# Patient Record
Sex: Male | Born: 1995 | Hispanic: No | Marital: Single | Smoking: Current some day smoker
Health system: Southern US, Community
[De-identification: ages and names within clinical notes are randomized; demographics above are authoritative.]

## PROBLEM LIST (undated history)

## (undated) HISTORY — PX: APPENDECTOMY: SHX54

---
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
## 2015-04-30 ENCOUNTER — Ambulatory Visit (INDEPENDENT_AMBULATORY_CARE_PROVIDER_SITE_OTHER): Payer: Medicaid Other | Admitting: Licensed Clinical Social Worker

## 2015-04-30 DIAGNOSIS — Z62820 Parent-biological child conflict: Secondary | ICD-10-CM

## 2015-04-30 NOTE — BH Specialist Note (Signed)
Referring Provider: Maia Breslow, MD Session Time:  4:00 - 5:00 (1 hour) Type of Service: Behavioral Health - Individual/Family Interpreter: No.  Interpreter Name & Language: NA   PRESENTING CONCERNS:  Bakari Nikolai is a 19 y.o. male brought in by mother. Shadrack Brummitt was referred to [facility] for parent child relationship problems.   GOALS ADDRESSED:  Enhance positive coping skills including breathing, progressive muscle relaxation, and Mindshift app Enhance positive child-parent interactions by teaching specific praise and selective ignoring to mom    INTERVENTIONS:  Anger/impulse managment Assessed current condition/needs Built rapport Observed parent-child interaction Relationship training Stress managment    ASSESSMENT/OUTCOME:  Rasheem is smiling and candid about his anger in his family relationships. Mom is in agreement. When offered parenting support at the beginning of the session, Cadon quickly intervened and said that his parents' parenting is not the problem, just his anger and then resulting anguish and remorse after "blanking" on family members. History of trust issues within the parent-child relationship. Discussed relationship training, including rapport-building and making requests within relationships.   Discussed anger and basic brain chemistry. Encouraged learning coping strategies before he's progressed to full-blown anger in order to learn. Encouraged practicing at least 30 days to make a new habit. Mindshift app practiced, Dennis was very enthusiastic.   Mom has many complaints about Peder's behavior but does say some very nice things about him, including that he is the "kindest" and "quickest to compromise" out of her 4 children. Reflected to both, Arman shared appreciation with his mom. Encouraged mom to keep giving praise like this.     TREATMENT PLAN:  Yuta will use Mindshift to learn more about anger and anxiety.  He will be  proactive, practice relaxation proactively and continuously manage his anger, sense that this is where his anxiety comes from. Mom will give praise and use selective ignoring when thinking about Santino's behaviors. Family voiced agreement and satisfaction.   PLAN FOR NEXT VISIT: Perle wants to try on his own. Either Roseanne Reno or his mother can contact for additional sessions, either together, with Roseanne Reno alone, or with mom and dad for strategies for parenting teenagers.   Scheduled next visit: No next visit at this time.   Meyer Arora Jonah Blue [facility] Clinician [facility]

## 2016-04-04 ENCOUNTER — Emergency Department (HOSPITAL_COMMUNITY)
Admission: EM | Admit: 2016-04-04 | Discharge: 2016-04-04 | Disposition: A | Discharge status: Home / Self Care | Payer: BLUE CROSS/BLUE SHIELD | Attending: Emergency Medicine | Admitting: Emergency Medicine

## 2016-04-04 ENCOUNTER — Encounter (HOSPITAL_COMMUNITY): Payer: Self-pay | Admitting: Emergency Medicine

## 2016-04-04 DIAGNOSIS — Y999 Unspecified external cause status: Secondary | ICD-10-CM | POA: Insufficient documentation

## 2016-04-04 DIAGNOSIS — Y939 Activity, unspecified: Secondary | ICD-10-CM | POA: Insufficient documentation

## 2016-04-04 DIAGNOSIS — L03115 Cellulitis of right lower limb: Secondary | ICD-10-CM | POA: Diagnosis not present

## 2016-04-04 DIAGNOSIS — Y929 Unspecified place or not applicable: Secondary | ICD-10-CM | POA: Insufficient documentation

## 2016-04-04 DIAGNOSIS — F17210 Nicotine dependence, cigarettes, uncomplicated: Secondary | ICD-10-CM | POA: Diagnosis not present

## 2016-04-04 DIAGNOSIS — W57XXXA Bitten or stung by nonvenomous insect and other nonvenomous arthropods, initial encounter: Secondary | ICD-10-CM | POA: Diagnosis not present

## 2016-04-04 DIAGNOSIS — S80861A Insect bite (nonvenomous), right lower leg, initial encounter: Secondary | ICD-10-CM | POA: Insufficient documentation

## 2016-04-04 MED ORDER — DIPHENHYDRAMINE HCL 25 MG PO CAPS
25.0000 mg | ORAL_CAPSULE | Freq: Once | ORAL | Status: AC
  Administered 2016-04-04: 25 mg via ORAL
  Filled 2016-04-04: qty 1

## 2016-04-04 MED ORDER — DOXYCYCLINE HYCLATE 100 MG PO CAPS: 100.0000 mg | ORAL_CAPSULE | Freq: Two times a day (BID) | ORAL | Status: DC

## 2016-04-04 MED ORDER — ACETAMINOPHEN 325 MG PO TABS
650.0000 mg | ORAL_TABLET | Freq: Once | ORAL | Status: AC
  Administered 2016-04-04: 650 mg via ORAL
  Filled 2016-04-04: qty 2

## 2016-04-04 NOTE — Discharge Instructions (Signed)
Read the information below.   You are being prescribed an antibiotics. Take as prescribed. You can take claritin or zyrtec for itch relief. Keep leg elevated. You can take tylenol or motrin for pain relief. You can apply ice to the area for relief.  Use the prescribed medication as directed.  Please discuss all new medications with your pharmacist.   Follow up with your primary care doctor.  It is very important that you return to the ED if the swelling does not improve in the next 24 hours.  You may return to the Emergency Department at any time for worsening condition or any new symptoms that concern you. Return to the ED if you develop a fever, worsening swelling/pain/redness, inability to walk or move joint, or streaking.    Cellulitis Cellulitis is an infection of the skin and the tissue under the skin. The infected area is usually red and tender. This happens most often in the arms and lower legs. HOME CARE   Take your antibiotic medicine as told. Finish the medicine even if you start to feel better.  Keep the infected arm or leg raised (elevated).  Put a warm cloth on the area up to 4 times per day.  Only take medicines as told by your doctor.  Keep all doctor visits as told. GET HELP IF:  You see red streaks on the skin coming from the infected area.  Your red area gets bigger or turns a dark color.  Your bone or joint under the infected area is painful after the skin heals.  Your infection comes back in the same area or different area.  You have a puffy (swollen) bump in the infected area.  You have new symptoms.  You have a fever. GET HELP RIGHT AWAY IF:   You feel very sleepy.  You throw up (vomit) or have watery poop (diarrhea).  You feel sick and have muscle aches and pains.   This information is not intended to replace advice given to you by your health care provider. Make sure you discuss any questions you have with your health care provider.   Document  Released: 02/22/2008 Document Revised: 05/27/2015 Document Reviewed: 11/21/2011 Elsevier Interactive Patient Education [employer]2016 Elsevier Inc.

## 2016-04-04 NOTE — ED Notes (Signed)
Patient states he got bit by a spider last night. Patient left foot is swollen. Patient has on dot on the the lower left leg that is black.

## 2016-04-04 NOTE — Progress Notes (Addendum)
Pt states he saw a brown spider last night on his leg . He states, "it bit me."  Positive warmth and reddness to the lower leg with some edema. Pt states the pain is a 9/10. Right foot elevated on a pillow. Pt stated he was starved and asked for a sandwich and drink. Pt was given a [place]key sandwich and a coke. Positive pedal pulse.

## 2016-04-05 NOTE — ED Provider Notes (Signed)
CSN: [id number]     Arrival date & time 04/04/16  1915 History   First MD Initiated Contact with Patient 04/04/16 2151     Chief Complaint  Patient presents with  . Insect Bite    spider     (Consider location/radiation/quality/duration/timing/severity/associated sxs/prior Treatment) HPI Comments: Gene Fox is a 20 y.o. male who presents to ED with complaint of spider bite. Patient states at midnight he felt a sharp pain in his right anterior lower extremity. When he looked down he saw a brown spider on his leg. He batted the spider away. He reports a black spot on his lower leg at the site of the spider bite with appreciable redness, warmth, and swelling. Pain is 10/10 and described as an "itching, fire-like" pain. He has had some associated muscle soreness. Denies numbness, weakness, or fevers. He is able to ambulate. He has not tried any treatments at home. No immunocompromising conditions. No history of recent long distance travel/surgeries/immobilizations, no h/o blood clots, no h/o cancer, no hemoptysis. No chest pain or shortness of breath. Per pt tdap within the last 5 years.    The history is provided by the patient and medical records.    History reviewed. No pertinent past medical history. Past Surgical History  Procedure Laterality Date  . Appendectomy     History reviewed. No pertinent family history. Social History  Substance Use Topics  . Smoking status: Light Tobacco Smoker    Types: Cigarettes    Start date: 12/22/2011  . Smokeless tobacco: None     Comment: according to mother smokes only occasionally with friends  . Alcohol Use: None    Review of Systems  Constitutional: Negative for fever.  Respiratory: Negative for shortness of breath.   Cardiovascular: Negative for chest pain.  Gastrointestinal: Negative for nausea and vomiting.  Musculoskeletal: Positive for myalgias.  Skin: Positive for color change and wound.  Neurological: Negative for weakness  and numbness.      Allergies  Review of patient's allergies indicates no known allergies.  Home Medications   Prior to Admission medications   Medication Sig Start Date End Date Taking? Authorizing Provider  doxycycline (VIBRAMYCIN) 100 MG capsule Take 1 capsule (100 mg total) by mouth 2 (two) times daily. 04/04/16   Lona Kettle, PA-C  ibuprofen (ADVIL,MOTRIN) 600 MG tablet Take 600 mg by mouth every 6 (six) hours as needed.    Historical Provider, MD   BP 103/64 mmHg  Pulse 60  Temp(Src) 98.1 F (36.7 C) (Oral)  Resp 18  Ht  (1.651 m)  Wt 56.7 kg  BMI 20.80 kg/m2  SpO2 100% Physical Exam  Constitutional: He appears well-developed and well-nourished. No distress.  HENT:  Head: Normocephalic and atraumatic.  Eyes: Conjunctivae are normal. No scleral icterus.  Neck: Normal range of motion.  Pulmonary/Chest: Effort normal. No respiratory distress.  Musculoskeletal: Normal range of motion.  Active ROM of right ankle intact. No TTP of posterior calf. No palpable cords.   Neurological: He is alert. Coordination and gait normal.  Strength of right ankle and toes intact. Sensation of right foot grossly intact. Patient able to ambulate without assistance.   Skin: Skin is warm and dry. He is not diaphoretic.     Psychiatric: He has a normal mood and affect. His behavior is normal.    ED Course  Procedures (including critical care time) Labs Review [id number] - No data to display  Imaging Review No results found. I have personally reviewed  and evaluated these images and lab results as part of my medical decision-making.   EKG Interpretation None      MDM   Final diagnoses:  Cellulitis of right lower extremity   Patient is afebrile and non-toxic appearing in NAD. No tachycardia, hypotension, or other sxs to suggest severe infection. Small erosion site noted on right lower anterior extremity with warmth and erythema. Swelling appreciated on anterior lower  extremity and extends to just proximal of right toes. No TTP of posterior calf, no palpable cords. Well's score for DVT -2, low suspicion for DVT. Active ROM, strength, and sensation intact; patient able to ambulate - low suspicion for septic joint. No area of fluctuance noted to suggest abscess. Suspect ?cellulitis vs. ?allergic reaction to possible spider bite. Benadryl and tylenol given in ED. Rx ABX. Symptomatic management discussed. Strict return precautions discussed. Pt voiced understanding and is agreeable.     Lona Kettleshley Laurel Meyer, PA-C 04/05/16 0125   Gwyneth SproutWhitney Plunkett, MD 04/10/16 2128

## 2016-04-06 ENCOUNTER — Encounter: Payer: Self-pay | Admitting: Pediatrics

## 2016-04-23 ENCOUNTER — Ambulatory Visit: Payer: Self-pay

## 2016-04-23 ENCOUNTER — Ambulatory Visit (INDEPENDENT_AMBULATORY_CARE_PROVIDER_SITE_OTHER): Payer: BLUE CROSS/BLUE SHIELD | Admitting: Physician Assistant

## 2016-04-23 VITALS — BP 110/70 | HR 60 | Temp 97.7°F | Resp 16 | Ht 65.0 in | Wt 115.4 lb

## 2016-04-23 DIAGNOSIS — M546 Pain in thoracic spine: Secondary | ICD-10-CM

## 2016-04-23 DIAGNOSIS — M62830 Muscle spasm of back: Secondary | ICD-10-CM | POA: Diagnosis not present

## 2016-04-23 MED ORDER — PREDNISONE 20 MG PO TABS: ORAL_TABLET | ORAL | 0 refills | Status: AC

## 2016-04-23 MED ORDER — CYCLOBENZAPRINE HCL 10 MG PO TABS: 5.0000 mg | ORAL_TABLET | Freq: Three times a day (TID) | ORAL | 0 refills | Status: DC | PRN

## 2016-04-23 NOTE — Progress Notes (Signed)
04/23/2016 4:04 PM   DOB: [birth year] / MRN: [id number]  SUBJECTIVE:  Gene Fox is a 20 y.o. male presenting for low back pain.  He delivers matresses for a living.  He complains of hurting his throacic spine during a heavy lift about 3 days ago. Since that time he has developed moderate to sever thoracic paraspinal pain and associates radiation to the right fingers.   He has not tried any medication for this problem. He has tried the tens unit and this did not help.  He denies a history of trauma.    He has No Known Allergies.   He  has no past medical history on file.    He  reports that he has been smoking Cigarettes.  He started smoking about 4 years ago. He does not have any smokeless tobacco history on file. He  has no sexual activity history on file. The patient  has a past surgical history that includes Appendectomy.  His family history is not on file.  Review of Systems  Constitutional: Negative for chills and fever.  Respiratory: Negative for cough.   Cardiovascular: Negative for chest pain.  Gastrointestinal: Negative for nausea.  Genitourinary: Negative for dysuria.  Musculoskeletal: Positive for back pain and myalgias. Negative for falls, joint pain and neck pain.  Skin: Negative for itching and rash.  Neurological: Negative for dizziness and headaches.  Psychiatric/Behavioral: Negative for depression.    The problem list and medications were reviewed and updated by myself where necessary and exist elsewhere in the encounter.   OBJECTIVE:  BP 110/70   Pulse 60   Temp 97.7 F (36.5 C) (Oral)   Resp 16   Ht  (1.651 m)   Wt 115 lb 6.4 oz (52.3 kg)   SpO2 100%   BMI 19.20 kg/m   Physical Exam  Constitutional: He is oriented to person, place, and time. He appears well-developed and well-nourished.  Cardiovascular: Normal rate and regular rhythm.   Pulmonary/Chest: Effort normal and breath sounds normal.  Abdominal: He exhibits no distension.    Musculoskeletal: Normal range of motion.  Neurological: He is alert and oriented to person, place, and time. He has normal reflexes. He displays no atrophy, no tremor and normal reflexes. No cranial nerve deficit or sensory deficit. He exhibits normal muscle tone. He displays no seizure activity. Coordination and gait normal. GCS eye subscore is 4. GCS verbal subscore is 5. GCS motor subscore is 6.  Skin: Skin is warm and dry.    No results found for this or any previous visit (from the past 72 hour(s)).  No results found.  ASSESSMENT AND PLAN  [name] was seen today for back pain.  Diagnoses and all orders for this visit:  Muscle spasm of back: He is in what appears to be moderately severe pain.  Given his complaint of radicular pain per HPI opting for steroids to help calm this down.  Flexeril as needed.  He denies a history of diabetes and body habitus does not lend to suspicion of that diagnosis.  -     cyclobenzaprine (FLEXERIL) 10 MG tablet; Take 0.5-1 tablets (5-10 mg total) by mouth 3 (three) times daily as needed for muscle spasms (May cause drowsiness.).  Right-sided thoracic back pain -     predniSONE (DELTASONE) 20 MG tablet; Take 3 in the morning for 3 days, then 2 in the morning for 3 days, and then 1 in the morning for 3 days.    The patient is  advised to call or return to clinic if he does not see an improvement in symptoms, or to seek the care of the closest emergency department if he worsens with the above plan.   Deliah Boston, MHS, PA-C [facility] 04/23/2016 4:04 PM

## 2016-04-23 NOTE — Patient Instructions (Signed)
     IF you received an x-ray today, you will receive an invoice from [facility]. Please contact [facility] at [phone] with questions or concerns regarding your invoice.   IF you received labwork today, you will receive an invoice from [facility]. Please contact [facility] at [phone] with questions or concerns regarding your invoice.   Our billing staff will not be able to assist you with questions regarding bills from these companies.  You will be contacted with the lab results as soon as they are available. The fastest way to get your results is to activate your My Chart account. Instructions are located on the last page of this paperwork. If you have not heard from us regarding the results in 2 weeks, please contact this office.      

## 2016-05-17 IMAGING — CR DG HAND COMPLETE 3+V*R*
3 series · 3 of 3 positions shown · non-contrast
Comparison: None.

CLINICAL DATA: Punched wall, with injury to right hand 2 weeks ago.
Mild swelling and pain about the right hand, particularly overlying
the fifth metacarpophalangeal joint. Initial encounter.

EXAM:
RIGHT HAND - COMPLETE 3+ VIEW

[x hand pa right]
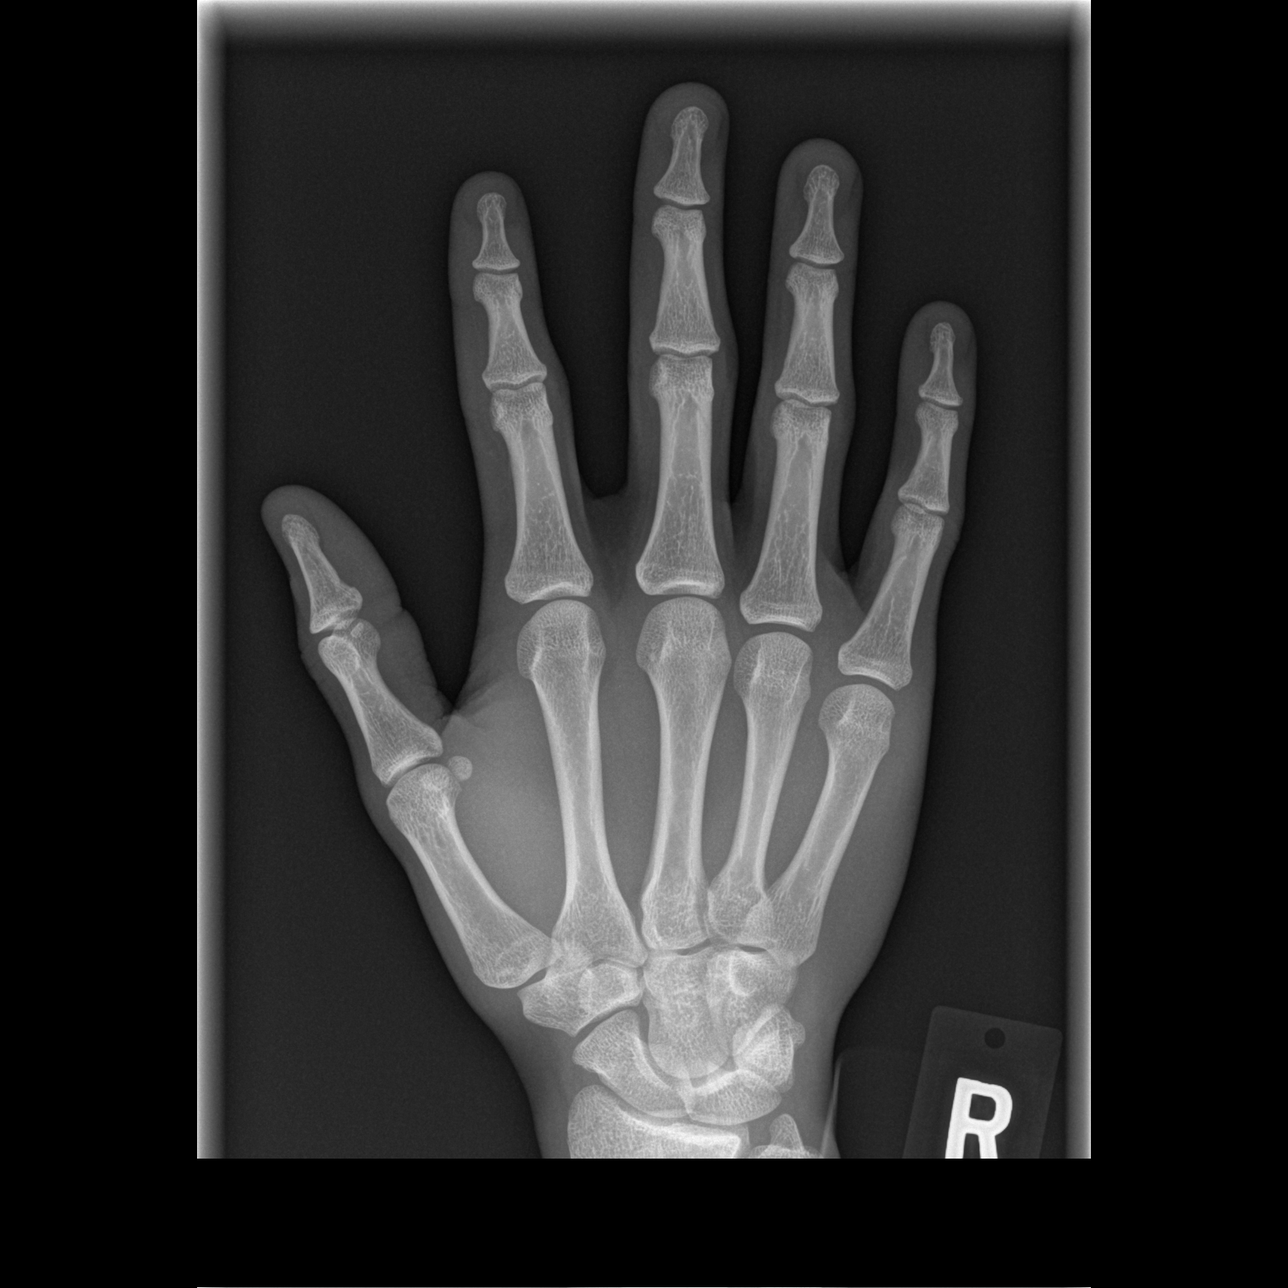

[x hand oblique right]
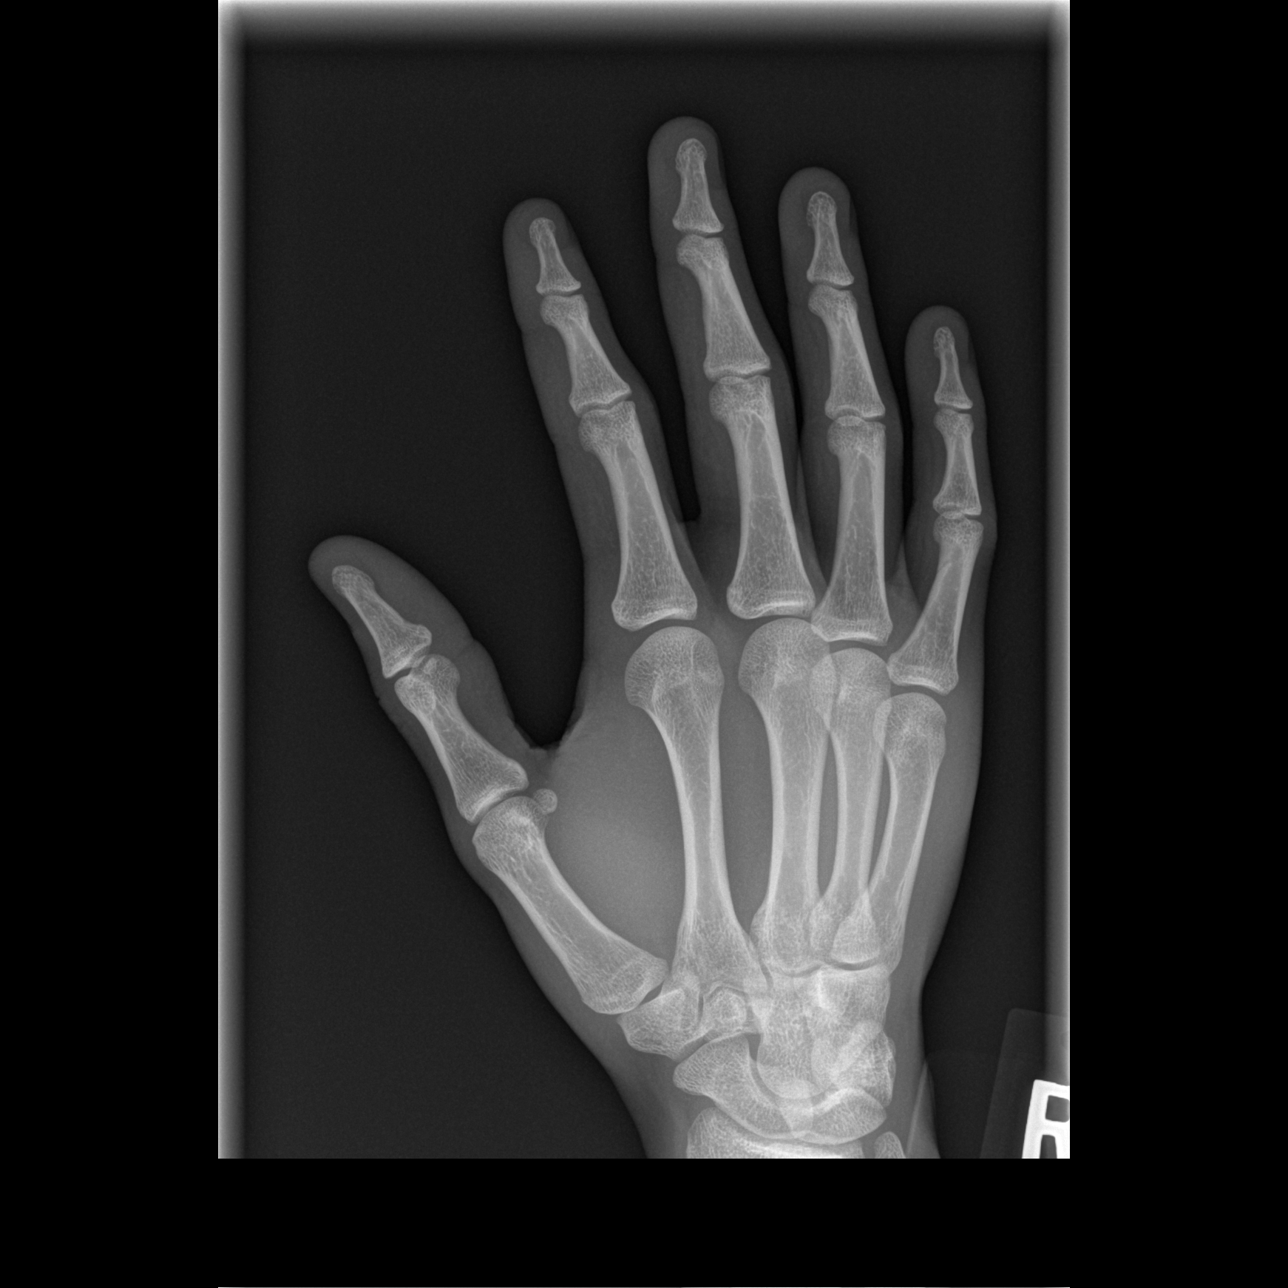

[x hand lat right]
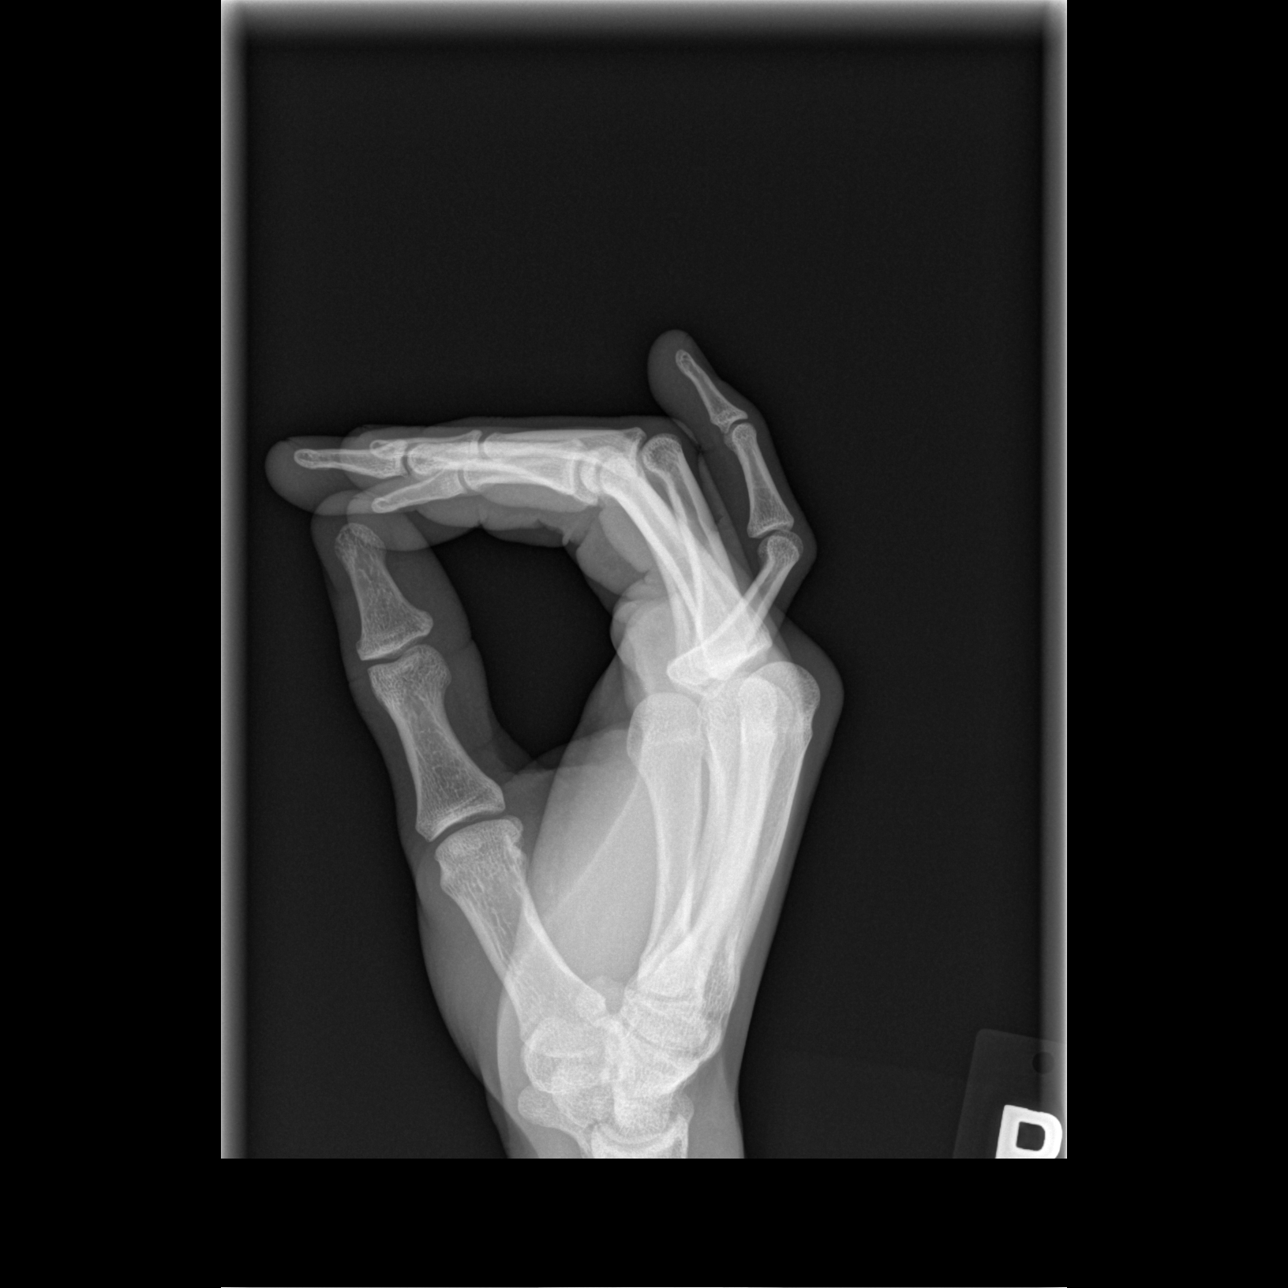

[3 of 3 positions shown; findings below may reference images not displayed]

FINDINGS: There is no evidence of fracture or dislocation. The joint spaces
are preserved. The carpal rows are intact, and demonstrate normal
alignment. The soft tissues are unremarkable in appearance.
IMPRESSION: No evidence of fracture or dislocation.

## 2016-10-04 ENCOUNTER — Encounter: Payer: Self-pay | Admitting: Pediatrics

## 2016-10-04 ENCOUNTER — Ambulatory Visit (INDEPENDENT_AMBULATORY_CARE_PROVIDER_SITE_OTHER): Payer: No Typology Code available for payment source | Admitting: Pediatrics

## 2016-10-04 VITALS — Temp 99.8°F | Wt 116.0 lb

## 2016-10-04 DIAGNOSIS — J029 Acute pharyngitis, unspecified: Secondary | ICD-10-CM

## 2016-10-04 DIAGNOSIS — R509 Fever, unspecified: Secondary | ICD-10-CM

## 2016-10-04 DIAGNOSIS — J111 Influenza due to unidentified influenza virus with other respiratory manifestations: Secondary | ICD-10-CM

## 2016-10-04 LAB — POC INFLUENZA A&B (BINAX/QUICKVUE)
Influenza A, POC: NEGATIVE
Influenza B, POC: NEGATIVE

## 2016-10-04 LAB — POCT RAPID STREP A (OFFICE): Rapid Strep A Screen: NEGATIVE

## 2016-10-04 MED ORDER — OSELTAMIVIR PHOSPHATE 75 MG PO CAPS: 75.0000 mg | ORAL_CAPSULE | Freq: Two times a day (BID) | ORAL | 0 refills | Status: AC

## 2016-10-04 NOTE — Progress Notes (Signed)
   Subjective:     Gene Fox, is a 21 y.o. male who presents with fever, headache and sore throat.   History provider by patient and mother No interpreter necessary.  Chief Complaint  Patient presents with  . Fever    x2 days, giving tylenol last dose 2 hours ago  . Nasal Congestion    HPI:   Gene Fox, is a 21 y.o. male who presents with fever, headache and sore throat.  He states that 2 days ago he developed a pounding headache and fever.  Did not take his temperature but felt like he had a tactile fever with chills.  No known sick contacts. No vomiting, diarrhea or rashes. +cough.  No nasal congestion or rhinorrhea.   Has had some muscle aches, but may be from playing basketball.    Review of Systems   As given in HPI  Patient's history was reviewed and updated as appropriate: allergies, current medications, past medical history, past social history and problem list.     Objective:     Temp 99.8 F (37.7 C) (Temporal)   Wt 116 lb (52.6 kg)   BMI 19.30 kg/m   Physical Exam  General: alert, tired-appearing, 21 year old male. Lying on exam table. No acute distress HEENT: normocephalic, atraumatic. PERRL. Nares clear. Moist mucus membranes. Oropharynx erythematous without exudates. Cardiac: normal S1 and S2. Regular rate and rhythm. No murmurs, rubs or gallops. Pulmonary: normal work of breathing. No retractions. No tachypnea. Clear bilaterally without wheezes, crackles or rhonchi.  Abdomen: soft, nontender, nondistended. No masses. Extremities: Warm and well-perfused. No edema. Brisk capillary refill Skin: no rashes or lesions Neuro: alert, age-appropriate, no gross focal deficits   Assessment & Plan:   1. Influenza with respiratory manifestation Negative POC influenza A and B and negative POC strep.  However, given history of chills and possible myalgias in light of the flu epidemic prescribed Tamiflu.  Encouraged warm water or tea and honey for sore  throat.   Supportive care and return precautions reviewed.  Return if symptoms worsen or fail to improve.  Glennon HamiltonAmber Miroslava Santellan, MD

## 2016-10-05 LAB — CULTURE, GROUP A STREP: Organism ID, Bacteria: NORMAL

## 2016-10-06 ENCOUNTER — Emergency Department (HOSPITAL_COMMUNITY)
Admission: EM | Admit: 2016-10-06 | Discharge: 2016-10-06 | Disposition: A | Discharge status: Home / Self Care | Payer: BLUE CROSS/BLUE SHIELD | Attending: Emergency Medicine | Admitting: Emergency Medicine

## 2016-10-06 ENCOUNTER — Emergency Department (HOSPITAL_COMMUNITY): Payer: BLUE CROSS/BLUE SHIELD

## 2016-10-06 ENCOUNTER — Encounter (HOSPITAL_COMMUNITY): Payer: Self-pay

## 2016-10-06 DIAGNOSIS — J029 Acute pharyngitis, unspecified: Secondary | ICD-10-CM | POA: Insufficient documentation

## 2016-10-06 DIAGNOSIS — F17210 Nicotine dependence, cigarettes, uncomplicated: Secondary | ICD-10-CM | POA: Diagnosis not present

## 2016-10-06 LAB — RAPID STREP SCREEN (MED CTR MEBANE ONLY): Streptococcus, Group A Screen (Direct): NEGATIVE

## 2016-10-06 MED ORDER — HYDROCODONE-ACETAMINOPHEN 5-325 MG PO TABS
1.0000 | ORAL_TABLET | Freq: Once | ORAL | Status: AC
  Administered 2016-10-06: 1 via ORAL
  Filled 2016-10-06: qty 1

## 2016-10-06 MED ORDER — DEXAMETHASONE SODIUM PHOSPHATE 10 MG/ML IJ SOLN
10.0000 mg | Freq: Once | INTRAMUSCULAR | Status: AC
  Administered 2016-10-06: 10 mg via INTRAMUSCULAR
  Filled 2016-10-06: qty 1

## 2016-10-06 MED ORDER — AMOXICILLIN 500 MG PO CAPS: 500.0000 mg | ORAL_CAPSULE | Freq: Three times a day (TID) | ORAL | 0 refills | Status: DC

## 2016-10-06 NOTE — ED Provider Notes (Signed)
[facility]-EMERGENCY DEPT Provider Note   CSN: [id number] Arrival date & time: 10/06/16  1240   History   Chief Complaint Chief Complaint  Patient presents with  . Sore Throat    HPI Gene Fox is a 21 y.o. male.  HPI   Patient with no significant PMH comes to the ER with complaints of severe sore throat and pain with swallowing. He has had mild cough and fever at home TMAX 103. He has been taking Ibuprofen but it has not been helping with the pain. He has not had any problems being able to tolerate secretions. No CP, SOB, le swelling, neck pain, headache, confusion, weakness. Pain is bilateral.  History reviewed. No pertinent past medical history.  Patient Active Problem List   Diagnosis Date Noted  . Nonvenomous spider bite of right lower leg 04/04/2016  . 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 Sprain of ankle, right 12/27/2013    Past Surgical History:  Procedure Laterality Date  . APPENDECTOMY         Home Medications    Prior to Admission medications   Medication Sig Start Date End Date Taking? Authorizing Provider  cyclobenzaprine (FLEXERIL) 10 MG tablet Take 0.5-1 tablets (5-10 mg total) by mouth 3 (three) times daily as needed for muscle spasms (May cause drowsiness.). 04/23/16   Ofilia NeasMichael L Clark, PA-C  doxycycline (VIBRAMYCIN) 100 MG capsule Take 1 capsule (100 mg total) by mouth 2 (two) times daily. Patient not taking: Reported on 04/23/2016 04/04/16   Lona KettleAshley Laurel Meyer, PA-C  ibuprofen (ADVIL,MOTRIN) 600 MG tablet Take 600 mg by mouth every 6 (six) hours as needed.    Historical Provider, MD  oseltamivir (TAMIFLU) 75 MG capsule Take 1 capsule (75 mg total) by mouth 2 (two) times daily. 10/04/16 10/09/16  Glennon HamiltonAmber Beg, MD    Family History History reviewed. No pertinent family history.  Social History Social History  Substance Use Topics  . Smoking status: Current Some Day Smoker    Packs/day: 1.00    Types: Cigarettes    Start date: 12/22/2011  . Smokeless  tobacco: Never Used     Comment: according to mother smokes only occasionally with friends  . Alcohol use No     Allergies   Patient has no known allergies.   Review of Systems Review of Systems Review of Systems All other systems negative except as documented in the HPI. All pertinent positives and negatives as reviewed in the HPI.   Physical Exam Updated Vital Signs BP 103/72 (BP Location: Left Arm)   Pulse 74   Temp 98.7 F (37.1 C) (Oral)   Resp 15   Ht 5\' 9"  (1.753 m)   Wt 52.6 kg   SpO2 98%   BMI 17.13 kg/m   Physical Exam  Constitutional: He is oriented to person, place, and time. He appears well-developed and well-nourished. No distress.  HENT:  Head: Normocephalic and atraumatic.  Right Ear: Tympanic membrane, external ear and ear canal normal.  Left Ear: Tympanic membrane, external ear and ear canal normal.  Nose: Nose normal. No rhinorrhea. Right sinus exhibits no maxillary sinus tenderness and no frontal sinus tenderness. Left sinus exhibits no maxillary sinus tenderness and no frontal sinus tenderness.  Mouth/Throat: Uvula is midline and mucous membranes are normal. No trismus in the jaw. Normal dentition. No dental abscesses or uvula swelling. Oropharyngeal exudate and posterior oropharyngeal edema present. No posterior oropharyngeal erythema or tonsillar abscesses.  No submental edema, tongue not elevated, no trismus. No impending airway obstruction;  Pt able to speak full sentences, swallow intact, no drooling, stridor, or tonsillar/uvula displacement. No palatal petechia  Eyes: Conjunctivae are normal.  Neck: Trachea normal, normal range of motion and full passive range of motion without pain. Neck supple. No neck rigidity. Normal range of motion present. No Brudzinski's sign noted.  Flexion and extension of neck without pain or difficulty. Able to breath without difficulty in extension.  Cardiovascular: Normal rate and regular rhythm.   Pulmonary/Chest:  Effort normal and breath sounds normal. No stridor. No respiratory distress. He has no wheezes.  Abdominal: Soft. There is no tenderness.  No obvious evidence of splenomegaly. Non ttp.   Musculoskeletal: Normal range of motion.  Lymphadenopathy:       Head (right side): No preauricular and no posterior auricular adenopathy present.       Head (left side): No preauricular and no posterior auricular adenopathy present.    He has cervical adenopathy.  Neurological: He is alert and oriented to person, place, and time.  Skin: Skin is warm and dry. No rash noted. He is not diaphoretic.  Psychiatric: He has a normal mood and affect.  Nursing note and vitals reviewed.    ED Treatments / Results  Labs (all labs ordered are listed, but only abnormal results are displayed) Labs Reviewed  RAPID STREP SCREEN (NOT AT [facility])  CULTURE, GROUP A STREP [facility])    EKG  EKG Interpretation None       Radiology Dg Chest 2 View  Result Date: 10/06/2016 CLINICAL DATA:  Sore throat for 3 days.  Smoker. EXAM: CHEST  2 VIEW COMPARISON:  None. FINDINGS: Cardiomediastinal silhouette is normal. No pleural effusions or focal consolidations. Trachea projects midline and there is no pneumothorax. Soft tissue planes and included osseous structures are non-suspicious.   IMPRESSION: Normal chest. Electronically Signed   By: Awilda Metro M.D.   On: 10/06/2016 13:52    Procedures Procedures (including critical care time)  Medications Ordered in ED Medications  dexamethasone (DECADRON) injection 10 mg (not administered)     Initial Impression / Assessment and Plan / ED Course  I have reviewed the triage vital signs and the nursing notes.  Pertinent labs & imaging results that were available during my care of the patient were reviewed by me and considered in my medical decision making (see chart for details).    Pt rapid strep test neg but clinical suspicion for bacterial sore throat. Pt is tolerating  secretions. Presentation not concerning for peritonsillar abscess or spread of infection to deep spaces of the throat; patent airway. Pt will be discharged with Amoxicillin, given IM shot of Decadron in ED 10mg .  Specific return precautions discussed. Recommended PCP follow up.  Specific return precautions discussed.  Recommended PCP follow up. Pt appears safe for discharge.    Final Clinical Impressions(s) / ED Diagnoses   Final diagnoses:  Pharyngitis, unspecified etiology    New Prescriptions New Prescriptions   No medications on file     Marlon Pel, PA-C 10/06/16 1452    Melene Plan, DO 10/06/16 2302

## 2016-10-06 NOTE — ED Triage Notes (Signed)
PT C/O SORE THROAT AND FEVER X3 DAYS. PT STS HIS THROAT IS RED WITH WHITE SPOTS. NO FEVER TODAY, PT LAST TOOK IBUPROFEN 800MG  THIS AM.

## 2016-10-06 NOTE — Discharge Instructions (Signed)
Take antibiotic in completion. Continue to stay well-hydrated. Gargle warm salt water and spit it out. Continued to alternate between Tylenol and ibuprofen for pain. May consider over-the-counter Benadryl for additional relief. Followup with your primary care doctor in 5-7 days for recheck of ongoing symptoms that return to emergency department for emergent changing or worsening of symptoms.

## 2016-10-06 NOTE — ED Triage Notes (Signed)
Pt c/o feeling lightheaded. Tolerating PO liquids. BP 95/55. HR 59 O2 sat 99

## 2016-10-08 LAB — CULTURE, GROUP A STREP (THRC)

## 2016-12-06 ENCOUNTER — Ambulatory Visit (INDEPENDENT_AMBULATORY_CARE_PROVIDER_SITE_OTHER): Payer: BLUE CROSS/BLUE SHIELD | Admitting: Pediatrics

## 2016-12-06 ENCOUNTER — Encounter: Payer: Self-pay | Admitting: Pediatrics

## 2016-12-06 VITALS — BP 104/60 | Ht 67.5 in | Wt 113.4 lb

## 2016-12-06 DIAGNOSIS — G43909 Migraine, unspecified, not intractable, without status migrainosus: Secondary | ICD-10-CM | POA: Diagnosis not present

## 2016-12-06 DIAGNOSIS — Z0001 Encounter for general adult medical examination with abnormal findings: Secondary | ICD-10-CM

## 2016-12-06 DIAGNOSIS — M549 Dorsalgia, unspecified: Secondary | ICD-10-CM

## 2016-12-06 DIAGNOSIS — Z113 Encounter for screening for infections with a predominantly sexual mode of transmission: Secondary | ICD-10-CM

## 2016-12-06 DIAGNOSIS — R41840 Attention and concentration deficit: Secondary | ICD-10-CM

## 2016-12-06 LAB — POCT RAPID HIV: RAPID HIV, POC: NEGATIVE

## 2016-12-06 NOTE — Patient Instructions (Addendum)
  For help with quitting smoking, please talk to [facility] C[facility]ation Counselor at [phone]. Or the [employer] [place] Quit Line: [employer] Service is available 24/7 toll-free at [employer]1-800-QUIT-NOW [phone]([phone]). Quit coaching is available by phone in [place] and [place], with translation service available for other languages  Follow up instructions: 1) Insomnia - work on sleep hygiene, especially not having a phone or screen in your bedroom. Limit eating, exercising or drinking within 3 hours of bedtime. Do not drink caffeinated problems. Use melatonin for the next 2 weeks 2) Inattention - please give the academic form to a professor who taught you last semester. It will be very important that you start sleeping a full night's sleep more regularly before you come back so that we can make sure this inattention isn't related to a sleep problem 3) Back pain - we recommend rest, icing the areas and using icy hot more regularly, as that has worked before. Look up physical therapy exercises  4) Keep up the great work to stop smoking. Please call the number listed above - they can help you!

## 2016-12-06 NOTE — Progress Notes (Signed)
Adolescent Well Care Visit Gene Fox is a 21 y.o. male who is here for well care.    PCP:  Theadore Nan, MD   History was provided by the patient.  Current Issues: Current concerns include:  1) Sleeping  - goes to bed around midnight, getting 8-9 hours of sleep if a good day and having trouble waking up. Has no trouble falling asleep, but difficulty waking up. Reports that he has poor sleep hygiene (looks at his phone and watches movies in bed)  2) Inattentive in school - mom is concerned that this is ADHD like sister. He finds it difficult to wake up in the morning. Finds that he is easily distracted. The patient also notices this with tasks outside of school (e.g. Trips to the grocery store, finishing conversations with family members). Mom reports that the patient struggles with patience. Patient tried adderall once from a friend and "it was the most productive day of my lfe." Patient completed self-report form and had 3 dark boxes checked in Part A and 3 dark boxes checked in Part B.  3) Migraine - patient has had this problem for "a long time" probably 3-5 years. The headaches have not worsened or changed but are more bothersome to the patient. He has 5 headaches per month. Head pain is usually left sided in the frontal and temporal area and pain on the outer edge of the opposite eye. +photophobia, phonophobia. +aura (blurry vision). Headache triggers are disruptions in sleep cycle or not eating for a long period. Has tried Tylenol migraine for headaches, takes 5 times a month. He stopped taking that and now is using ibuprofen. He takes the medicine when his aura comes and tries to get it before pain actually stops. 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 Has sharp right-sided mid-back non-radiating pain on the lateral side of the back that is worse when breathing, sitting and standing. It is improved with massage and "back cracking" by his brother. He has another spot of sharp left shoulder area (medial to the shoulder blade but not midline) that sometimes radiates to his head or arm but only during a massage. This area of pain is improved with massage. The patient has taken ibuprofen for the pain, but it did not help. IcyHot helped for both pain, but it only provided temporary relief.  Past concerns have included patient smoking  Nutrition: Nutrition/Eating Behaviors: eats fruit most but not all days, eats vegetables every day Adequate calcium in diet?: drinks milk most days Supplements/ Vitamins: no  Exercise/ Media: Play any Sports?/ Exercise: plays basketball Screen Time:  > 2 hours-counseling provided Media Rules or Monitoring?: no  Sleep:  Sleep: see HPI  Social Screening: Lives with:  Mother, father, brother and sister Parental relations:  good Activities, Work, and Regulatory affairs officer?: see HPI Concerns regarding behavior with peers?  no Stressors of note: no  Education: School Name: at [employer] but took the semester off because of struggles with grades School Grade: Associates degree in science, transfer program to go [place] performance: struggling with grades (getting Cs and a few Ds) and it's a struggle to get [employer] Behavior: see HPI  Menstruation:   No LMP for male patient.  Confidentiality was discussed with the patient and, if applicable, with caregiver as well. Patient's personal or confidential phone number: [phone]  Tobacco?  Intermittently, but trying to quit Secondhand smoke exposure?  no Drugs/ETOH?  no  Sexually Active?  no    Safe at home, in school & in relationships?  Yes Safe to self?  Yes   Screenings: Patient has a dental home: used to go to Dr. Raeanne Barryup but hasn't been in a few years.  Counseled to return  The patient completed the Rapid Assessment for Adolescent Preventive Services screening questionnaire and the following topics were identified as risk factors and discussed: healthy eating, weapon use, mental health issues, school problems, family problems and screen time  In addition, the following topics were discussed as part of anticipatory guidance healthy eating and exercise.  PHQ-9 completed and results indicated score of 10 (appetite, sleep, concentration) are consistent with moderate depression but patient has negative PHQ2  Physical Exam:  Vitals:   12/06/16 1132  BP: 104/60  Weight: 113 lb 6.4 oz (51.4 kg)  Height: 5' 7.5" (1.715 m)   BP 104/60   Ht 5' 7.5" (1.715 m)   Wt 113 lb 6.4 oz (51.4 kg)   BMI 17.50 kg/m  Body mass index: body mass index is 17.5 kg/m. Growth percentile SmartLinks can only be used for patients less than 21 years old.   Hearing Screening   Method: Audiometry   125Hz  250Hz  500Hz  1000Hz  2000Hz  3000Hz  4000Hz  6000Hz  8000Hz   Right ear:   20 20 20  20     Left ear:   20 20 20  20       Visual Acuity Screening   Right eye Left eye Both eyes  Without correction: 20/20 20/20 20/20   With correction:       General Appearance:   alert, oriented, no acute distress  HENT: Normocephalic, no obvious abnormality, conjunctiva clear  Mouth:   Normal appearing teeth, no obvious discoloration, dental caries, or dental caps  Neck:   Supple; thyroid: no enlargement, symmetric, no tenderness/mass/nodules     Lungs:   Clear to auscultation bilaterally, normal work of breathing  Heart:   Regular rate and rhythm, S1 and S2 normal, no murmurs;   Abdomen:   Soft, non-tender, no mass, or organomegaly  GU genitalia not examined  Musculoskeletal:   Tone and strength strong and symmetrical, all extremities. TTP right lateral mid-back, left upper back just medial to shoulder blade               Lymphatic:   No cervical adenopathy  Skin/Hair/Nails:    Skin warm, dry and intact, no rashes, no bruises or petechiae  Neurologic:   Strength, gait, and coordination normal and age-appropriate     Assessment and Plan:   Roseanne RenoHassan is a 21 year old male with multiple complaints that appear to be a mixture of behavioral and medical  Insomnia - likely due to poor sleep hygiene, concern with constellation of insomnia, poor appetite and inattention of depression of underlying depression (PHQ9 score c/w moderate depression) but patient denies this and PHQ2 negative - Reviewed sleep hygiene, especially removing all screens from the bedroom - Recommend melatonin over the next 2 weeks - Return in 2 weeks for follow up  Inattention - difficult to untangle whether this is ADHD or secondary to sleep deprivation but school failure is obviously concerning - Reinforced importance to patient of resolving sleep issues - Provided adult self assessment for ADHD and school forms - Return  in 2 weeks for follow up  Migraines - patient's headache distribution and pattern most consistent with migraines, with concern for impact of poor sleep hygiene on headache frequency - Recommend hydration - Continue ibuprofen for acute migraine - Would consider additional abortive medication given that current medication does not work but will have to reassess once patient has worked on his sleep hygiene  Back Pain - Reviewed RICE with patient - Recommended online PT exercises, especially for rotator cuff exercise - Recommend more chronic use of things that have worked in the past like  BMI is appropriate for age  Routine Screening  Hearing screening result:normal  Vision screening result: normal   Patient continues to smoke on RAPPS. Provided quit line information  Rapid HIV negative  GC, Chlamydia pending  Counseling provided for all of the vaccine components  Orders Placed This Encounter  Procedures  . GC/Chlamydia Probe Amp  . POCT Rapid HIV     No Follow-up on  file.Dorene Sorrow, MD

## 2016-12-06 NOTE — Progress Notes (Signed)
I saw and evaluated the patient, performing the key elements of the service. I developed the management plan that is described in the resident's note, and I agree with the content.  Met with family  ASRS 12/06/2016  1. How often do you have trouble wrapping up the final details of a project, once the challenging parts have been done? Often  2. How often do you have difficulty getting things done in order when you have to do a task that requires organization? Often  3. How often do you have problems remembering appointments or obligations? Sometimes  4. When you have a task that requires a lot of thought, how often do you avoid or delay getting started? Sometimes  5. How often do you fidget or squirm with your hands or feet when you have to sit down for a long time? Sometimes  6. How often do you feel overly active and compelled to do things, like you were driven by a motor? Rarely  7. How often do you make careless mistakes when you have to work on a boring or difficult project? Often  8. How often do you have difficulty keeping your attention when you are doing boring or repetitive work? Very Often  9. How often do you have difficulty concentrating on what people say to you, even when they are speaking to you directly? Often  10. How often do you misplace or have difficulty finding things at home or at work? Sometimes  11. How often are you distracted by activity or noise around you? Sometimes  13. How often do you feel restless or fidgety? Rarely  14. How often do you have difficulty unwinding and relaxing when you have time to yourself? Rarely  15. How often do you find yourself talking too much when you are in social situations? Rarely  16. When you are in a conversation, how often do you find yourself finishing the sentences of the people you are talking to, before they can finish them themselves? Never  How old were you when these problems first began to occur? (No Data)    Symptoms  present for several years.  [place], [place]                  12/06/2016, 4:00 PM

## 2016-12-07 LAB — GC/CHLAMYDIA PROBE AMP
CT Probe RNA: NOT DETECTED
GC Probe RNA: NOT DETECTED

## 2016-12-20 ENCOUNTER — Encounter: Payer: Self-pay | Admitting: Pediatrics

## 2016-12-20 ENCOUNTER — Ambulatory Visit (INDEPENDENT_AMBULATORY_CARE_PROVIDER_SITE_OTHER): Payer: BLUE CROSS/BLUE SHIELD | Admitting: Pediatrics

## 2016-12-20 VITALS — BP 100/60 | HR 56 | Ht 67.32 in | Wt 110.0 lb

## 2016-12-20 DIAGNOSIS — F900 Attention-deficit hyperactivity disorder, predominantly inattentive type: Secondary | ICD-10-CM

## 2016-12-20 DIAGNOSIS — M546 Pain in thoracic spine: Secondary | ICD-10-CM | POA: Diagnosis not present

## 2016-12-20 MED ORDER — IBUPROFEN 600 MG PO TABS: 600.0000 mg | ORAL_TABLET | Freq: Four times a day (QID) | ORAL | 1 refills | Status: AC | PRN

## 2016-12-20 MED ORDER — METHYLPHENIDATE HCL ER (CD) 30 MG PO CPCR: 30.0000 mg | ORAL_CAPSULE | ORAL | 0 refills | Status: DC

## 2016-12-20 NOTE — Progress Notes (Signed)
Subjective:     Gene Fox, is a 21 y.o. male  HPI  Chief Complaint  Patient presents with  . Follow-up    ADHD  . Back Pain    pt stated that he has been experiencing back pain x64mths now. 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   Inattention Started sleeping morel turned off phone, turned off tv and slept at same time every night helped the much, but still can't focus  Up at 7 , bed at 11 -12 Much more energy, wake up easier,  Focus is still not there   Cardiac: no racing ,no skipping, no hx MGM had a heart problem since she was born has a narrow valve,   To quit this work Has been able to work To restart school   Adult ADHD scored in the less likely/ unlikely range for hyperactive and borderline for inattentive with significant impairment noted (failing grades in college) Family also noted and observe inattention more than hyperactivity  Last visit association with poor sleep made hard to distimquish Also strong family hx of ADHD in two sisters.    (13 part a-, 16 in part B) 17 or more is positive   Review of Systems   The following portions of the patient's history were reviewed and updated as appropriate: allergies, current medications, past family history, past medical history, past social history, past surgical history and problem list.     Objective:     Blood pressure 100/60, pulse (!) 56, height 5' 7.32" (1.71 m), weight 110 lb (49.9 kg).  Physical Exam    Constitutional: He appears well-developed and well-nourished. No distress.  HENT:  Head: Normocephalic and atraumatic.  Nose: Nose normal.  Mouth/Throat: Oropharynx is clear and moist.  Eyes: Conjunctivae are normal. Right eye exhibits no discharge. Left eye exhibits no discharge.  Neck: Normal range of motion. No thyromegaly present.  Cardiovascular: Normal rate, regular rhythm and normal heart sounds.   No murmur heard. Pulmonary/Chest: No respiratory distress. He has no wheezes. He has no rales.  Abdominal: Soft. He exhibits no distension. There is no tenderness.  Musculoskeletal:  Tender over paraspinus muscle on left with swelling of that muscle , also tender at vertabre mid thoracic  Lymphadenopathy:    He has no cervical adenopathy.  Neurological: He displays normal reflexes. He exhibits normal muscle tone. Coordination normal.  Decreased toe with plantar stroking bilatera  Skin: Skin is warm and dry. No rash noted.         Assessment & Plan:   1. Acute left-sided thoracic back pain  Associated with heavy repetitive lifting and twisting at work--discussed ways to improve body mechanics at work  Possible radiculopathy with shooting pain once. Has not yet tried ibuprofen regularly  - ibuprofen (ADVIL,MOTRIN) 600 MG tablet; Take 1 tablet (600 mg total) by mouth every 6 (six) hours as needed.  Dispense: 100 tablet; Refill: 1 - Ambulatory referral  to Sports Medicine for further evaluation and treatment  2. Attention deficit hyperactivity disorder (ADHD), predominantly inattentive type  Trial of stimulants, noting that he will soon age out of this office.  Plans to quit this job and return to  School.   - methylphenidate (METADATE CD) 30 MG CR capsule; Take 1 capsule (30 mg total) by mouth every morning.  Dispense: 30 capsule; Refill: 0  Supportive care and return precautions reviewed.  Spent  25  minutes face to face time with patient; greater than 50% spent in  counseling regarding diagnosis and treatment plan.   Theadore Nan, MD

## 2016-12-23 ENCOUNTER — Telehealth: Payer: Self-pay | Admitting: *Deleted

## 2016-12-23 NOTE — Telephone Encounter (Signed)
Checked back on PA status. PA denied, will route to PCP.

## 2016-12-23 NOTE — Telephone Encounter (Signed)
Caller asking for PA for methylphenidate (METADATE CD) 30 MG CR capsule. Auth pending approval.Confirmation#:[id number] W.

## 2016-12-27 ENCOUNTER — Other Ambulatory Visit: Payer: Self-pay | Admitting: Pediatrics

## 2016-12-27 MED ORDER — METHYLPHENIDATE HCL ER (OSM) 54 MG PO TBCR: 54.0000 mg | EXTENDED_RELEASE_TABLET | Freq: Every day | ORAL | 0 refills | Status: DC

## 2016-12-27 NOTE — Telephone Encounter (Signed)
Left VM at the pharmacy that the PA was denied and we have made the doctor aware. We will call once we know what the plan is.

## 2016-12-27 NOTE — Telephone Encounter (Signed)
[employer] Rep called stating that pt is now using that pharmacy and they faxed a request for authorization, would like to know the status on pt's refill.

## 2016-12-28 MED ORDER — METHYLPHENIDATE HCL ER (OSM) 54 MG PO TBCR: 54.0000 mg | EXTENDED_RELEASE_TABLET | Freq: Every day | ORAL | 0 refills | Status: DC

## 2016-12-28 NOTE — Telephone Encounter (Signed)
Mother in clinic with sister.   Of note, sister is on methylphenidate CD, but this patient's prescription was denied.   Plan to change to Concerta 54 mg, will need name brand for medicaid authorization,  [facility] [place], they recommended re-ordering with [facility] for brand name and faxing it to them. They iwll attachi it to the other prescription when mom brings it in.

## 2016-12-28 NOTE — Telephone Encounter (Signed)
See later phone msg

## 2017-01-04 ENCOUNTER — Ambulatory Visit (INDEPENDENT_AMBULATORY_CARE_PROVIDER_SITE_OTHER): Payer: BLUE CROSS/BLUE SHIELD | Admitting: Student

## 2017-01-04 ENCOUNTER — Encounter: Payer: Self-pay | Admitting: Student

## 2017-01-04 VITALS — BP 91/52 | Ht 69.0 in | Wt 120.0 lb

## 2017-01-04 DIAGNOSIS — M546 Pain in thoracic spine: Secondary | ICD-10-CM | POA: Diagnosis not present

## 2017-01-04 MED ORDER — MELOXICAM 15 MG PO TABS: 15.0000 mg | ORAL_TABLET | Freq: Every day | ORAL | 1 refills | Status: AC

## 2017-01-04 MED ORDER — CYCLOBENZAPRINE HCL 10 MG PO TABS: 10.0000 mg | ORAL_TABLET | Freq: Every evening | ORAL | 1 refills | Status: AC | PRN

## 2017-01-04 NOTE — Assessment & Plan Note (Addendum)
This appears to be muscular and related to his job and poor posture. Recommended home exercise program to help with scapular retraction. This will also help with scapular stabilization. Offered physical therapy but he would like to try at home first. He will call if he would like to physical therapy referral after he talks with his mother. Discussed anti-inflammatory options. Will prescribe mobic and he will discontinue ibuprofen. We'll give him Flexeril to take as needed at night. This did help him in the past.  He will follow-up in 6 weeks to check progress. If he is all better then he does not need to follow-up. Do not believe her imaging is necessary at this time. If he is not improved in 6 weeks, would consider imaging versus physical therapy.

## 2017-01-04 NOTE — Progress Notes (Signed)
  Gene Fox - 21 y.o. male MRN [id number]  Date of birth: 06/23/1996  SUBJECTIVE:  Including CC & ROS.  CC: upper back pain  Presents with upper back pain that has been ongoing for the past 4 months. 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 It is worse when he is doing the movements. It is better with rest but does bother him when he sleeps. His brother has tried to massage it which did cause some tingling down his arm but he does not have that besides when there is being pressure put on his muscle.  He also complains of some neck pain as well. His difficulty to turn his head to the right. He denies any weakness. Denies any radicular symptoms. No acute injury but has gradually occurred over time. Similar problem occurred before last year. He was seen at an urgent care and given prednisone and a muscle relaxer. He took a muscle relaxer and had relief.   ROS: No unexpected weight loss, fever, chills, swelling, instability, muscle pain, numbness/tingling, redness, otherwise see HPI   PMHx - Updated and reviewed.  Contributory factors include: Negative PSHx - Updated and reviewed.  Contributory factors include:  Negative FHx - Updated and reviewed.  Contributory factors include:  Negative Social Hx - Updated and reviewed. 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 Medications - reviewed   DATA REVIEWED: PCP note and urgent care notes.  PHYSICAL EXAM:  VS: BP:(!) 91/52  HR: bpm  TEMP: ( )  RESP:   HT:5\' 9"  (175.3 cm)   WT:120 lb (54.4 kg)  BMI:17.8 PHYSICAL EXAM: Gen: NAD, alert, cooperative with exam, well-appearing HEENT: clear conjunctiva,  CV:  no edema, capillary refill brisk, normal rate Resp: non-labored Skin: no rashes, normal turgor  Neuro: no gross deficits.  Psych:  alert and oriented Neck: Inspection unremarkable. No palpable  stepoffs. Tenderness to palpation along bilateral trapezius muscles Negative Spurling's maneuver. Full neck range of motion Grip strength and sensation normal in bilateral hands Strength good C4 to T1 distribution No sensory change to C4 to T1 Negative Hoffman sign bilaterally Reflexes normal  Thoracic spine: Inspection shows poor posture with scapula protracted anteriorly Tenderness to palpation along the rhomboids and levator scapula left greater than right Full range of motion of thoracic spine Sensation intact distally.  ASSESSMENT & PLAN:   Acute left-sided thoracic back pain This appears to be muscular and related to his job and poor posture. Recommended home exercise program to help with scapular retraction. This will also help with scapular stabilization. Offered physical therapy but he would like to try at home first. He will call if he would like to physical therapy referral after he talks with his mother. Discussed anti-inflammatory options. Will prescribe mobic and he will discontinue ibuprofen. We'll give him Flexeril to take as needed at night. This did help him in the past.  He will follow-up in 6 weeks to check progress. If he is all better then he does not need to follow-up.

## 2017-01-13 ENCOUNTER — Ambulatory Visit: Payer: BLUE CROSS/BLUE SHIELD | Admitting: Pediatrics

## 2017-01-19 ENCOUNTER — Ambulatory Visit (INDEPENDENT_AMBULATORY_CARE_PROVIDER_SITE_OTHER): Payer: BLUE CROSS/BLUE SHIELD | Admitting: Pediatrics

## 2017-01-19 ENCOUNTER — Encounter: Payer: Self-pay | Admitting: Pediatrics

## 2017-01-19 DIAGNOSIS — F900 Attention-deficit hyperactivity disorder, predominantly inattentive type: Secondary | ICD-10-CM | POA: Diagnosis not present

## 2017-01-19 MED ORDER — METHYLPHENIDATE HCL ER (CD) 30 MG PO CPCR: 30.0000 mg | ORAL_CAPSULE | ORAL | 0 refills | Status: AC

## 2017-01-19 NOTE — Progress Notes (Signed)
Subjective:     Gene Fox, is a 21 y.o. male  HPI  Chief Complaint  Patient presents with  . Follow-up  Follow up ADHD  For one month on Metadate CD for about one month, got on dad [employer], has only $10 Co pay  Mom return Concerta prescription to me this morning, shredded  Plans to go back to school in fall, just got classes, [employer], likes programming, internet security, IT  Likes activity level, not too high or too low When it wears off, feels"off" out of it,  Was very focused and productive Back pain no change, muscle relax help, but pain came back when went back to work  Review of Systems  Constitutional: Negative for activity change, appetite change and fever.  HENT: Negative for congestion.   Respiratory: Negative for cough.   Cardiovascular: Negative for chest pain.  Gastrointestinal: Negative for abdominal pain and vomiting.  Skin: Negative for rash.  Neurological: Negative for headaches.  Psychiatric/Behavioral: Negative for behavioral problems, sleep disturbance and suicidal ideas.   Screening test recorded for on medicine  ASRS 01/19/2017  Part A Total Symptoms Positive 1  Part B Total Symptoms Positive 0  Comment on meds   NICHQ VANDERBILT ASSESSMENT SCALE-PARENT 01/19/2017  Completed by mother  Medication Metadate CD 30   Questions #1-9 (Inattention) 0  Questions #10-18 (Hyperactive/Impulsive) 0  Total Symptom Score for questions #1-18 2  Questions #19-40 (Oppositional/Conduct) 0  Questions #41, 42, 47(Anxiety Symptoms) 0  Questions #43-46 (Depressive Symptoms) 0  Comment for on meds     The following portions of the patient's history were reviewed and updated as appropriate: allergies, current medications, past family history, past medical history, past social history, past surgical history and problem list.    saw sports medicine for back pain, flexeril helped, but worse again when returned to work and lifting, waiting to go back to school    Objective:     Blood pressure 120/74, pulse (!) 52, height 5' 7.76" (1.721 m), weight 114 lb 3.2 oz (51.8 kg), SpO2 99 %.  Physical Exam  Constitutional: He appears well-developed and well-nourished. No distress.  HENT:  Head: Normocephalic and atraumatic.  Nose: Nose normal.  Mouth/Throat: Oropharynx is clear and moist.  Eyes: Conjunctivae and EOM are normal. Right eye exhibits no discharge. Left eye exhibits no discharge.  Neck: Normal range of motion. No thyromegaly present.  Cardiovascular: Normal rate, regular rhythm and normal heart sounds.   No murmur heard. Pulmonary/Chest: No respiratory distress. He has no wheezes. He has no rales.  Abdominal: Soft. He exhibits no distension. There is no tenderness.  Lymphadenopathy:    He has no cervical adenopathy.  Skin: Skin is warm and dry. No rash noted.       Assessment & Plan:   1. Attention deficit hyperactivity disorder (ADHD), predominantly inattentive type  Much improved symptoms on meds,  Looking for to restarting in college classes this fall aftre doing very poorly previously  - methylphenidate (METADATE CD) 30 MG CR capsule; Take 1 capsule (30 mg total) by mouth every morning.  Dispense: 30 capsule; Refill: 0 - methylphenidate (METADATE CD) 30 MG CR capsule; Take 1 capsule (30 mg total) by mouth every morning.  Dispense: 31 capsule; Refill: 0 - methylphenidate (METADATE CD) 30 MG CR capsule; Take 1 capsule (30 mg total) by mouth every morning.  Dispense: 31 capsule; Refill: 0  Will no longer be able to continue in our clinic after 21 years old Currently  seems stable on this dose and recommend continuing  Will need new provider; depend on father's insurance. [facility] have [facility] to [facility] before and [facility]   Please start establishing care at a new provider to that can maintain established for care  Supportive care and return precautions reviewed.  Spent  25  minutes  face to face time with patient; greater than 50% spent in counseling regarding diagnosis and treatment plan.   Theadore NanMCCORMICK, Ivelis Norgard, MD

## 2017-04-04 ENCOUNTER — Encounter: Payer: Self-pay | Admitting: Pediatrics

## 2017-04-04 ENCOUNTER — Telehealth: Payer: Self-pay | Admitting: Pediatrics

## 2017-04-04 NOTE — Progress Notes (Signed)
Request for letter regarding diagnosis and difficulty focusing.  There are issues regarding financial aid.   Letter written.

## 2017-04-04 NOTE — Telephone Encounter (Signed)
Belenda CruiseKristin called mom to let her know the forms is ready to be pick up.

## 2017-04-04 NOTE — Telephone Encounter (Signed)
Pt's mother came by the front office - she says that for college, he needs documentation from the doctor (PCP Mccormick) stating that he has a diagnosis of ADHD. Mom can be contacted @ [phone] when form is ready or with any questions. Form needed by tomorrow per mom - explained to mom that it can take up to 3-5 business days.

## 2017-04-27 ENCOUNTER — Ambulatory Visit: Payer: Self-pay | Admitting: Pediatrics

## 2017-05-20 DEATH — deceased

## 2018-04-04 IMAGING — DX DG CHEST 2V
2 series · 2 of 2 positions shown · non-contrast
Comparison: None.

CLINICAL DATA: Sore throat for 3 days.  Smoker.

EXAM:
CHEST  2 VIEW

[chest pa]
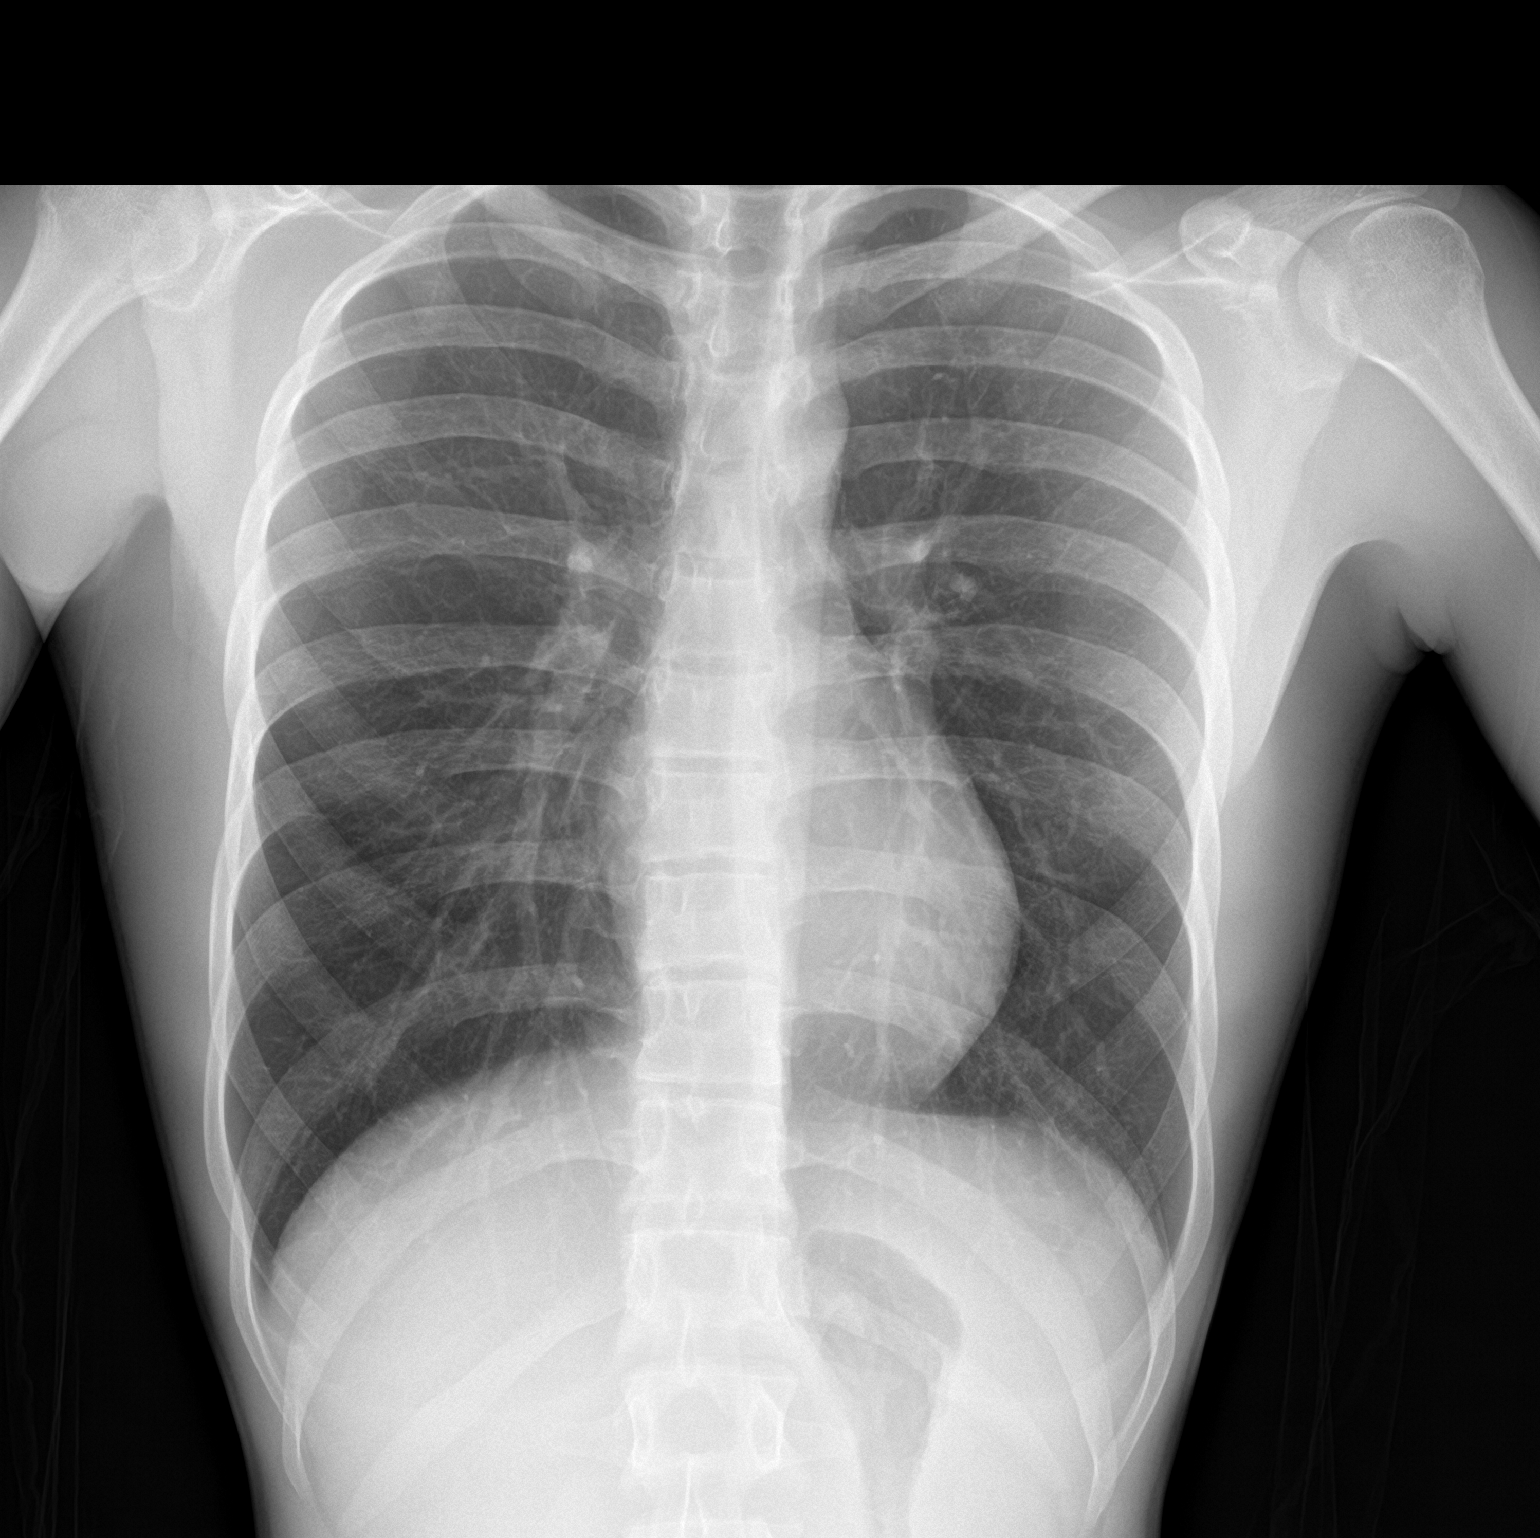

[chest lat]
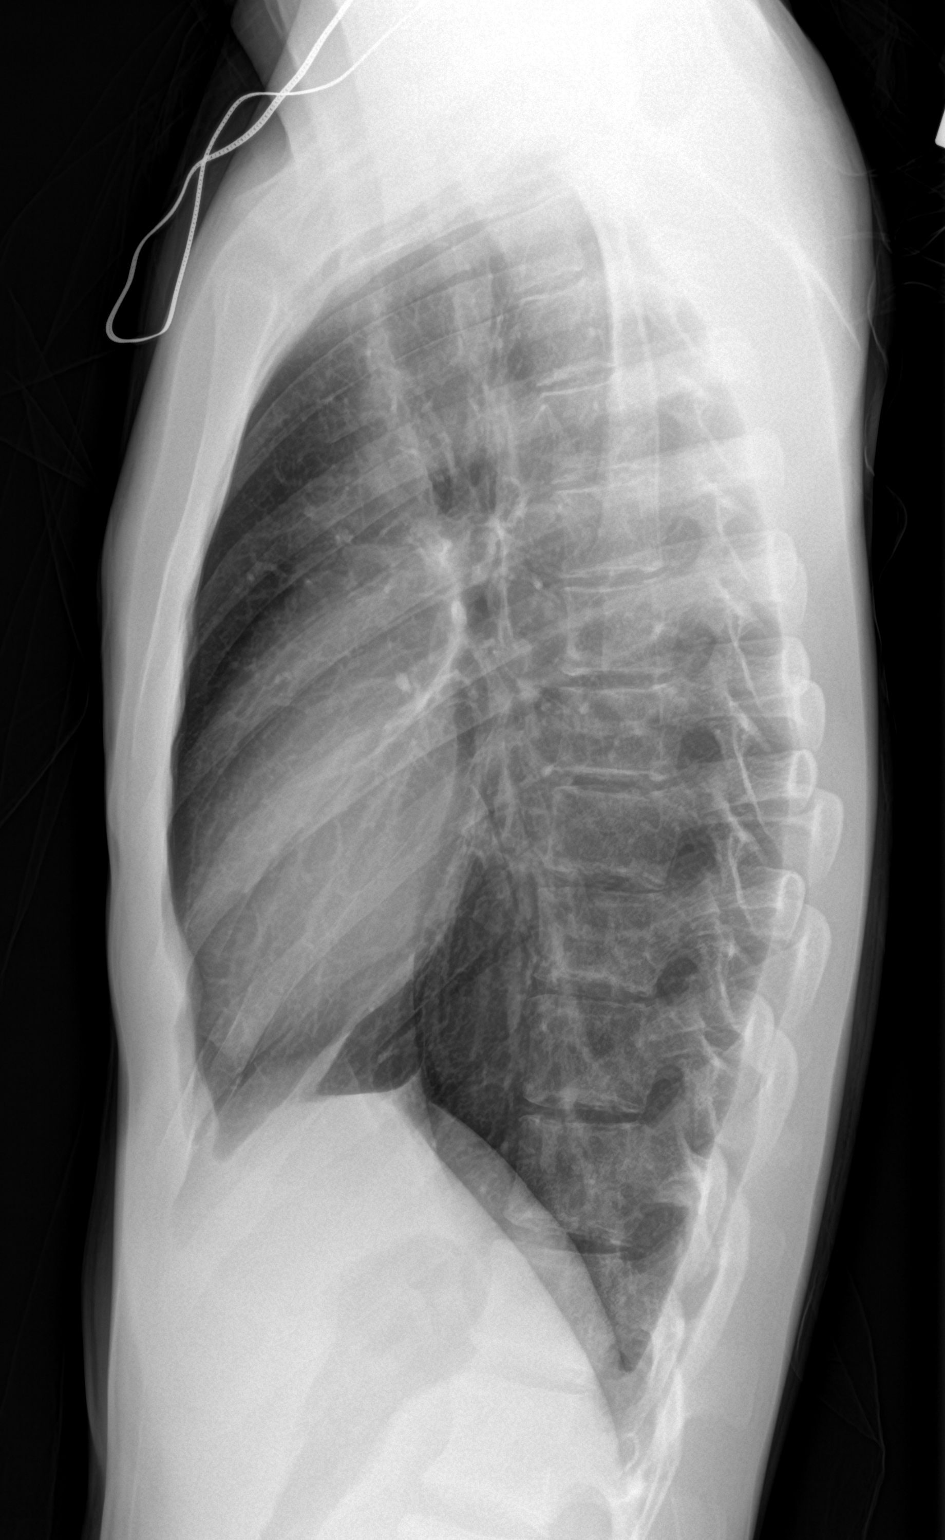

[2 of 2 positions shown; findings below may reference images not displayed]

FINDINGS: Cardiomediastinal silhouette is normal. No pleural effusions or
focal consolidations. Trachea projects midline and there is no
pneumothorax. Soft tissue planes and included osseous structures are
non-suspicious.
IMPRESSION: Normal chest.
